# Patient Record
Sex: Female | Born: 1990 | Race: Black or African American | Hispanic: No | Marital: Single | Smoking: Never smoker
Health system: Southern US, Community
[De-identification: ages and names within clinical notes are randomized; demographics above are authoritative.]

---

## 2015-08-04 ENCOUNTER — Emergency Department (HOSPITAL_COMMUNITY): Payer: Medicaid Other

## 2015-08-04 ENCOUNTER — Emergency Department (HOSPITAL_COMMUNITY)
Admission: EM | Admit: 2015-08-04 | Discharge: 2015-08-04 | Disposition: A | Discharge status: Home / Self Care | Payer: Medicaid Other | Attending: Emergency Medicine | Admitting: Emergency Medicine

## 2015-08-04 ENCOUNTER — Encounter (HOSPITAL_COMMUNITY): Payer: Self-pay | Admitting: Nurse Practitioner

## 2015-08-04 DIAGNOSIS — R531 Weakness: Secondary | ICD-10-CM | POA: Insufficient documentation

## 2015-08-04 DIAGNOSIS — Z3A01 Less than 8 weeks gestation of pregnancy: Secondary | ICD-10-CM | POA: Insufficient documentation

## 2015-08-04 DIAGNOSIS — O210 Mild hyperemesis gravidarum: Secondary | ICD-10-CM | POA: Diagnosis not present

## 2015-08-04 DIAGNOSIS — O9989 Other specified diseases and conditions complicating pregnancy, childbirth and the puerperium: Secondary | ICD-10-CM | POA: Insufficient documentation

## 2015-08-04 DIAGNOSIS — Z880 Allergy status to penicillin: Secondary | ICD-10-CM | POA: Insufficient documentation

## 2015-08-04 DIAGNOSIS — R5383 Other fatigue: Secondary | ICD-10-CM | POA: Insufficient documentation

## 2015-08-04 DIAGNOSIS — R1011 Right upper quadrant pain: Secondary | ICD-10-CM

## 2015-08-04 LAB — COMPREHENSIVE METABOLIC PANEL
ALK PHOS: 49 U/L (ref 38–126)
ALT: 11 U/L — AB (ref 14–54)
ANION GAP: 8 (ref 5–15)
AST: 15 U/L (ref 15–41)
Albumin: 3.5 g/dL (ref 3.5–5.0)
BILIRUBIN TOTAL: 0.5 mg/dL (ref 0.3–1.2)
BUN: 6 mg/dL (ref 6–20)
CALCIUM: 9.2 mg/dL (ref 8.9–10.3)
CO2: 22 mmol/L (ref 22–32)
CREATININE: 0.51 mg/dL (ref 0.44–1.00)
Chloride: 104 mmol/L (ref 101–111)
Glucose, Bld: 80 mg/dL (ref 65–99)
Potassium: 3.5 mmol/L (ref 3.5–5.1)
SODIUM: 134 mmol/L — AB (ref 135–145)
TOTAL PROTEIN: 6.7 g/dL (ref 6.5–8.1)

## 2015-08-04 LAB — CBC
HCT: 38.1 % (ref 36.0–46.0)
HEMOGLOBIN: 13 g/dL (ref 12.0–15.0)
MCH: 29.5 pg (ref 26.0–34.0)
MCHC: 34.1 g/dL (ref 30.0–36.0)
MCV: 86.6 fL (ref 78.0–100.0)
PLATELETS: 196 10*3/uL (ref 150–400)
RBC: 4.4 MIL/uL (ref 3.87–5.11)
RDW: 12.4 % (ref 11.5–15.5)
WBC: 7 10*3/uL (ref 4.0–10.5)

## 2015-08-04 LAB — URINALYSIS, ROUTINE W REFLEX MICROSCOPIC
Glucose, UA: NEGATIVE mg/dL
HGB URINE DIPSTICK: NEGATIVE
Ketones, ur: >80 mg/dL — AB
NITRITE: NEGATIVE
PROTEIN: NEGATIVE mg/dL
SPECIFIC GRAVITY, URINE: 1.031 — AB (ref 1.005–1.030)
pH: 5.5 (ref 5.0–8.0)

## 2015-08-04 LAB — URINE MICROSCOPIC-ADD ON

## 2015-08-04 LAB — LIPASE, BLOOD: LIPASE: 22 U/L (ref 11–51)

## 2015-08-04 LAB — I-STAT BETA HCG BLOOD, ED (MC, WL, AP ONLY)

## 2015-08-04 LAB — HCG, QUANTITATIVE, PREGNANCY: hCG, Beta Chain, Quant, S: 71618 m[IU]/mL — ABNORMAL HIGH (ref <5)

## 2015-08-04 MED ORDER — METOCLOPRAMIDE HCL 5 MG/ML IJ SOLN
10.0000 mg | Freq: Once | INTRAMUSCULAR | Status: AC
  Administered 2015-08-04: 10 mg via INTRAVENOUS
  Filled 2015-08-04: qty 2

## 2015-08-04 MED ORDER — SODIUM CHLORIDE 0.9 % IV BOLUS (SEPSIS)
1000.0000 mL | Freq: Once | INTRAVENOUS | Status: AC
Start: 2015-08-04 — End: 2015-08-04
  Administered 2015-08-04: 1000 mL via INTRAVENOUS

## 2015-08-04 MED ORDER — SODIUM CHLORIDE 0.9 % IV BOLUS (SEPSIS)
1000.0000 mL | Freq: Once | INTRAVENOUS | Status: AC
  Administered 2015-08-04: 1000 mL via INTRAVENOUS

## 2015-08-04 MED ORDER — METOCLOPRAMIDE HCL 10 MG PO TABS: 10.0000 mg | ORAL_TABLET | Freq: Four times a day (QID) | ORAL | Status: AC

## 2015-08-04 NOTE — ED Provider Notes (Signed)
CSN: [id number]     Arrival date & time 08/04/15  1836 History   First MD Initiated Contact with Patient 08/04/15 2021     Chief Complaint  Patient presents with  . Emesis     (Consider location/radiation/quality/duration/timing/severity/associated sxs/prior Treatment) HPI Nicole Parsons is a 24 y.o. female who presents to emergency department complaining of nausea, vomiting, possible dehydration. Patient states she just found out 10 days ago that she is pregnant. [facility] [place]. Patient states she just moved to [place] and does not have an OB/GYN here. She states in the last week she has had persistent nausea and vomiting. She states she is unable to keep anything down including fluids. She did have an ultrasound to confirm pregnancy 10 days ago which showed intrauterine gestational sac, no cardiac activity at that time. 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 At this time she denies any abdominal pain or cramping, no vaginal discharge, no vaginal bleeding. She has not tried any medications for nausea or vomiting. She states she feels weak and feels like she could be dehydrated. She denies any urinary symptoms. Denies any fever or chills.  History reviewed. No pertinent past medical history. History reviewed. No pertinent past surgical history. History reviewed. No pertinent family history. Social History  Substance Use Topics  . Smoking status: Never Smoker   . Smokeless tobacco: None  . Alcohol Use: No   OB History    No data available     Review of Systems  Constitutional: Positive for fatigue. Negative for fever and chills.  Respiratory: Negative for cough, chest tightness and shortness of breath.   Cardiovascular: Negative for chest pain, palpitations and leg swelling.  Gastrointestinal: Positive for nausea and vomiting. Negative for abdominal pain and diarrhea.  Genitourinary: Negative for dysuria, flank pain, vaginal  bleeding, vaginal discharge, vaginal pain and pelvic pain.  Musculoskeletal: Negative for myalgias, arthralgias, neck pain and neck stiffness.  Skin: Negative for rash.  Neurological: Positive for weakness. Negative for dizziness and headaches.  All other systems reviewed and are negative.     Allergies  Amoxicillin  Home Medications   Prior to Admission medications   Not on File   BP 116/89 mmHg  Pulse 76  Temp(Src) 98.8 F (37.1 C) (Oral)  Resp 16  SpO2 100%  LMP 06/17/2015 Physical Exam  Constitutional: She appears well-developed and well-nourished. No distress.  HENT:  Head: Normocephalic.  Oral mucosa dry  Eyes: Conjunctivae are normal.  Neck: Neck supple.  Cardiovascular: Normal rate, regular rhythm and normal heart sounds.   Pulmonary/Chest: Effort normal and breath sounds normal. No respiratory distress. She has no wheezes. She has no rales.  Abdominal: Soft. Bowel sounds are normal. She exhibits no distension. There is no tenderness. There is no rebound and no guarding.  Musculoskeletal: She exhibits no edema.  Neurological: She is alert.  Skin: Skin is warm and dry.  Psychiatric: She has a normal mood and affect. Her behavior is normal.  Nursing note and vitals reviewed.   ED Course  Procedures (including critical care time) Labs Review Labs Reviewed  COMPREHENSIVE METABOLIC PANEL - Abnormal; Notable for the following:    Sodium 134 (*)    ALT 11 (*)    All other components within normal limits  URINALYSIS, ROUTINE W REFLEX MICROSCOPIC (NOT AT [facility]) - Abnormal; Notable for the following:    Color, Urine AMBER (*)    APPearance CLOUDY (*)  Specific Gravity, Urine 1.031 (*)    Bilirubin Urine SMALL (*)    Ketones, ur >80 (*)    Leukocytes, UA SMALL (*)    All other components within normal limits  URINE MICROSCOPIC-ADD ON - Abnormal; Notable for the following:    Squamous Epithelial / LPF 6-30 (*)    Bacteria, UA FEW (*)    All other components  within normal limits  HCG, QUANTITATIVE, PREGNANCY - Abnormal; Notable for the following:    hCG, Beta Chain, Quant, [place] [address] (*)    All other components within normal limits  I-STAT BETA HCG BLOOD, ED ([facility], [facility], [facility] ONLY) - Abnormal; Notable for the following:    I-stat hCG, quantitative >2000.0 (*)    All other components within normal limits  CBC  LIPASE, BLOOD    Imaging Review US Abdomen Limited Ruq  08/04/2015  CLINICAL DATA:  Right upper quadrant abdominal pain. EXAM: US ABDOMEN LIMITED - RIGHT UPPER QUADRANT COMPARISON:  None. FINDINGS: Gallbladder: Physiologically distended. No gallstones or wall thickening visualized. No sonographic Murphy sign noted. Common bile duct: Diameter: 2-3 mm, normal. Liver: No focal lesion identified. Within normal limits in parenchymal echogenicity. Normal directional flow in the main portal vein. IMPRESSION: Normal right upper quadrant ultrasound. Electronically Signed   By: Rubye Oaks M.D.   On: 08/04/2015 22:03   I have personally reviewed and evaluated these images and lab results as part of my medical decision-making.   EKG Interpretation None      MDM   Final diagnoses:  Hyperemesis gravidarum    patient with nausea, vomiting, approximately 7 month pregnant, IUP confirmed 10 days ago and is in care every where. Denies any pelvic pain, no vaginal discharge or bleeding at this time. Vital signs are normal. Patient's blood was obtained at triage and is unremarkable. She does report some epigastric abdominal pain, at this time no tenderness. Will start IV fluids, patient does have greater than 80 ketones in her urine. We'll get ultrasound right upper quadrant to rule out cholelithiasis as the cause of her pain and vomiting.  10:26 PM Ultrasound is negative. Patient feels much better after 2 L of IV fluids and Reglan. She states she is ready to go home. I will discharge her home with Reglan. Will have a [facility]-up with [facility].  Instructed to go to [facility] if any issues. I did discuss with her that there was no cardiac activity seen on her ultrasound 10 days ago and that it may have been too early but that she does need to follow-up to make sure her pregnancy progresses normally. Patient voiced understanding  Filed Vitals:   08/04/15 1844 08/04/15 2122 08/04/15 2227  BP: 113/66 116/89 121/85  Pulse: 76 76 71  Temp: 98.8 F (37.1 C)    TempSrc: Oral    Resp: 16  17  SpO2: 98% 100% 100%     Jaynie Crumble, PA-C 08/05/15 0555  Mancel Bale, MD 08/05/15 1151

## 2015-08-04 NOTE — ED Notes (Addendum)
She c/o 2 weeks history severe n/v, headaches. She had a fever and some intermittent upper abd pain yesterday. She is [redacted] weeks pregnant. States shes concerned because she can not tolerate any oral intake. She has OBGYN doctor in [place] btu needs to transfer here because she doesn't live there anymore

## 2015-08-04 NOTE — ED Notes (Signed)
Pt stable, ambulatory, states understanding of discharge instructions 

## 2015-08-04 NOTE — Discharge Instructions (Signed)
Take Reglan as prescribed as needed for nausea and vomiting. Make sure to drink plenty of fluids to prevent dehydration. Follow[facility] or go to [facility] if any issues. You will need a repeat ultrasound, to make sure your pregnancy progresses normally  Hyperemesis Gravidarum Hyperemesis gravidarum is a severe form of nausea and vomiting that happens during pregnancy. Hyperemesis is worse than morning sickness. It may cause you to have nausea or vomiting all day for many days. It may keep you from eating and drinking enough food and liquids. Hyperemesis usually occurs during the first half (the first 20 weeks) of pregnancy. It often goes away once a woman is in her second half of pregnancy. However, sometimes hyperemesis continues through an entire pregnancy.  CAUSES  The cause of this condition is not completely known but is thought to be related to changes in the body's hormones when pregnant. It could be from the high level of the pregnancy hormone or an increase in estrogen in the body.  SIGNS AND SYMPTOMS   Severe nausea and vomiting.  Nausea that does not go away.  Vomiting that does not allow you to keep any food down.  Weight loss and body fluid loss (dehydration).  Having no desire to eat or not liking food you have previously enjoyed. DIAGNOSIS  Your health care provider will do a physical exam and ask you about your symptoms. He or she may also order blood tests and urine tests to make sure something else is not causing the problem.  TREATMENT  You may only need medicine to control the problem. If medicines do not control the nausea and vomiting, you will be treated in the hospital to prevent dehydration, increased acid in the blood (acidosis), weight loss, and changes in the electrolytes in your body that may harm the unborn baby (fetus). You may need IV fluids.  HOME CARE INSTRUCTIONS   Only take over-the-counter or prescription medicines as directed  by your health care provider.  Try eating a couple of dry crackers or toast in the morning before getting out of bed.  Avoid foods and smells that upset your stomach.  Avoid fatty and spicy foods.  Eat 5-6 small meals a day.  Do not drink when eating meals. Drink between meals.  For snacks, eat high-protein foods, such as cheese.  Eat or suck on things that have ginger in them. Ginger helps nausea.  Avoid food preparation. The smell of food can spoil your appetite.  Avoid iron pills and iron in your multivitamins until after 3-4 months of being pregnant. However, consult with your health care provider before stopping any prescribed iron pills. SEEK MEDICAL CARE IF:   Your abdominal pain increases.  You have a severe headache.  You have vision problems.  You are losing weight. SEEK IMMEDIATE MEDICAL CARE IF:   You are unable to keep fluids down.  You vomit blood.  You have constant nausea and vomiting.  You have excessive weakness.  You have extreme thirst.  You have dizziness or fainting.  You have a fever or persistent symptoms for more than 2-3 days.  You have a fever and your symptoms suddenly get worse. MAKE SURE YOU:   Understand these instructions.  Will watch your condition.  Will get help right away if you are not doing well or get worse.   This information is not intended to replace advice given to you by your health care provider. Make sure you discuss any questions  you have with your health care provider.   Document Released: 09/02/2005 Document Revised: 06/23/2013 Document Reviewed: 04/14/2013 Elsevier Interactive Patient Education [employer]

## 2017-05-22 IMAGING — US US ABDOMEN LIMITED
1 series · 14 of 25 positions shown · non-contrast
Comparison: None.

CLINICAL DATA: Right upper quadrant abdominal pain.

EXAM:
US ABDOMEN LIMITED - RIGHT UPPER QUADRANT

[Series 1: us abdomen limited · 0.15mm/px · 14 of 44 slices shown]
[im 1/44]
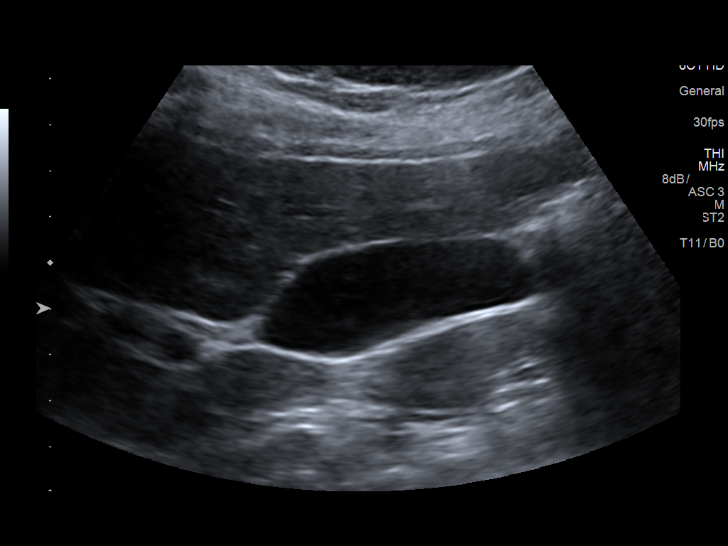
[im 4/44]
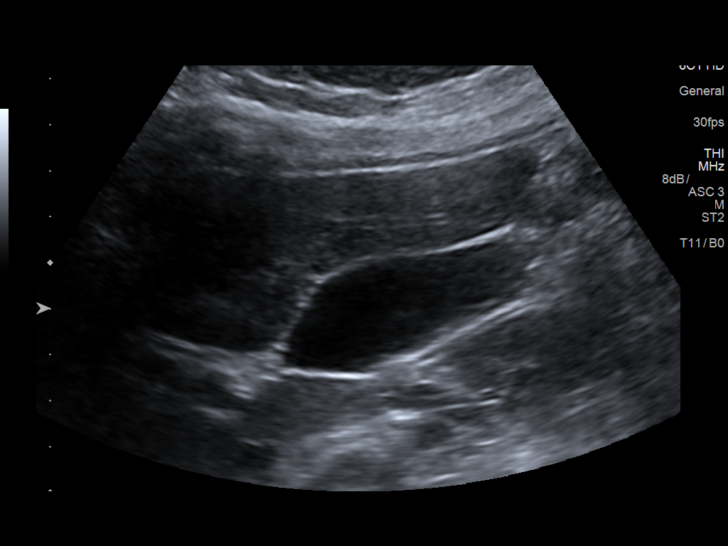
[im 8/44]
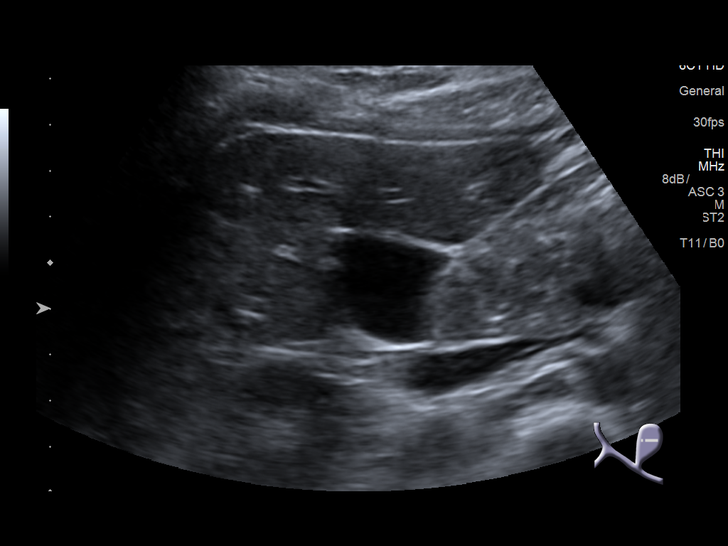
[im 11/44]
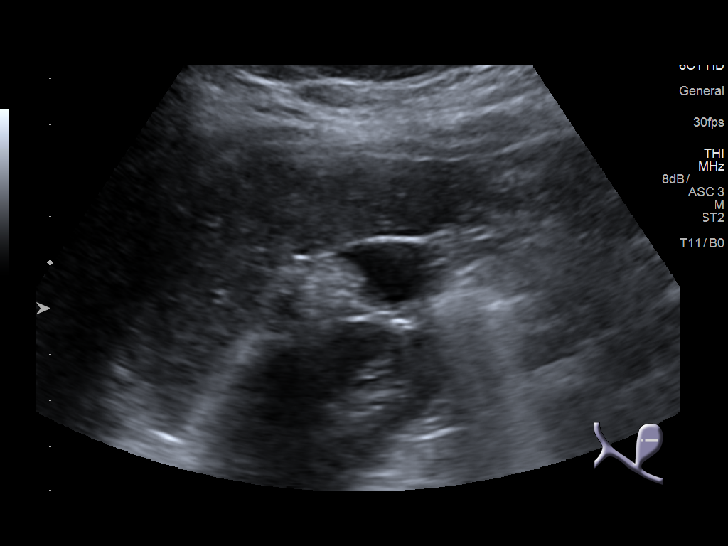
[im 15/44]
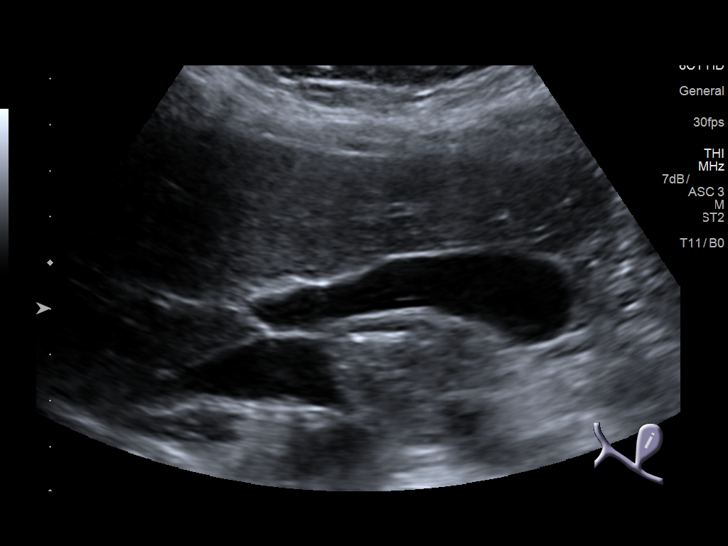
[im 17/44]
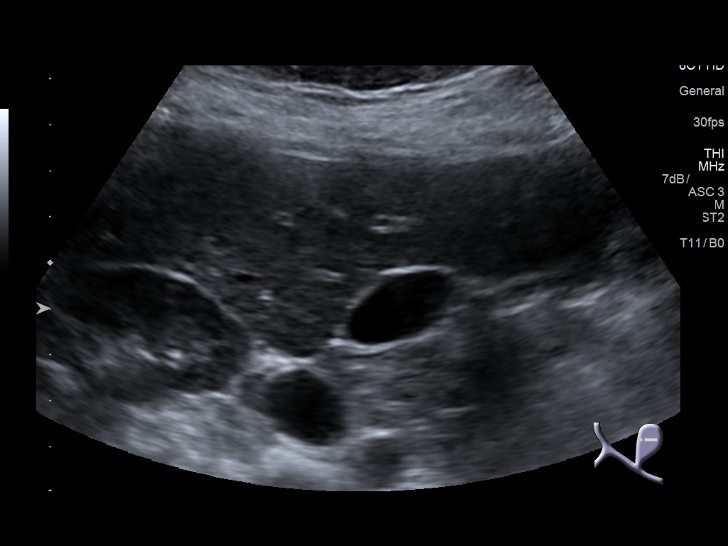
[im 20/44]
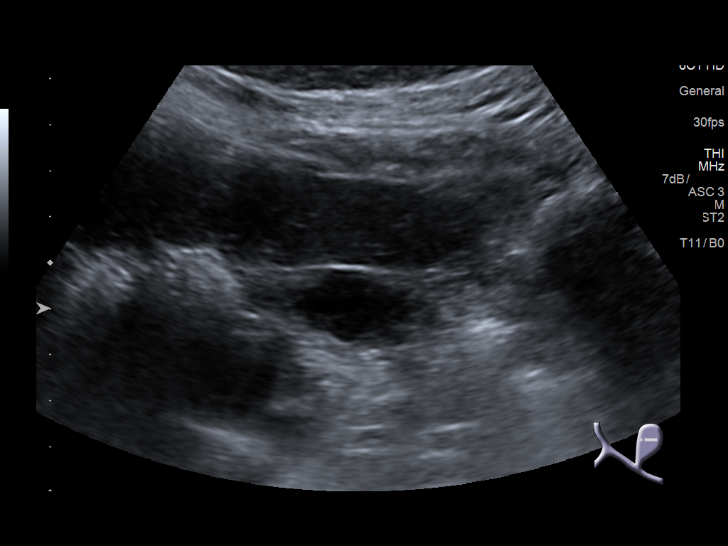
[im 24/44]
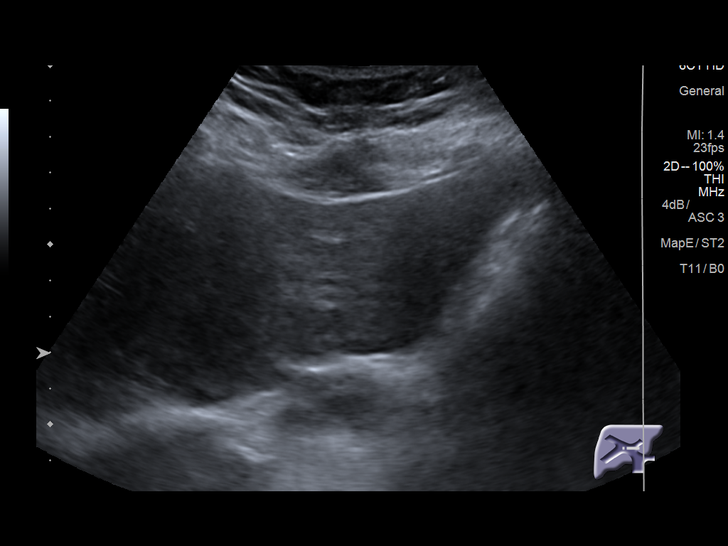
[im 27/44]
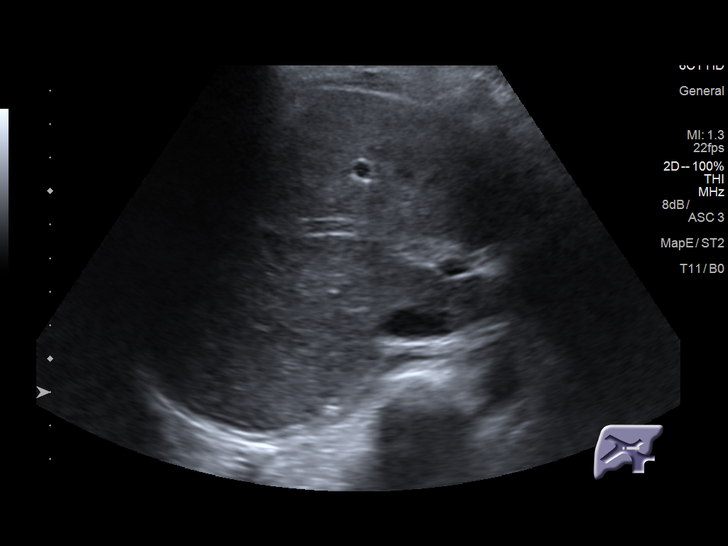
[im 29/44]
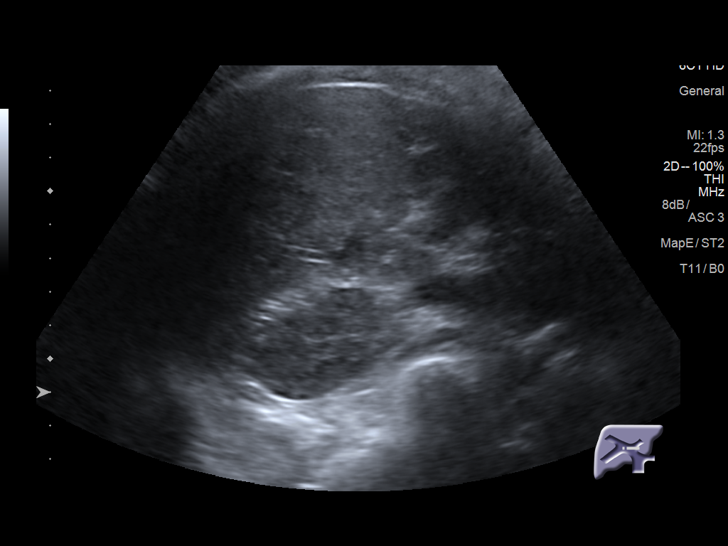
[im 33/44]
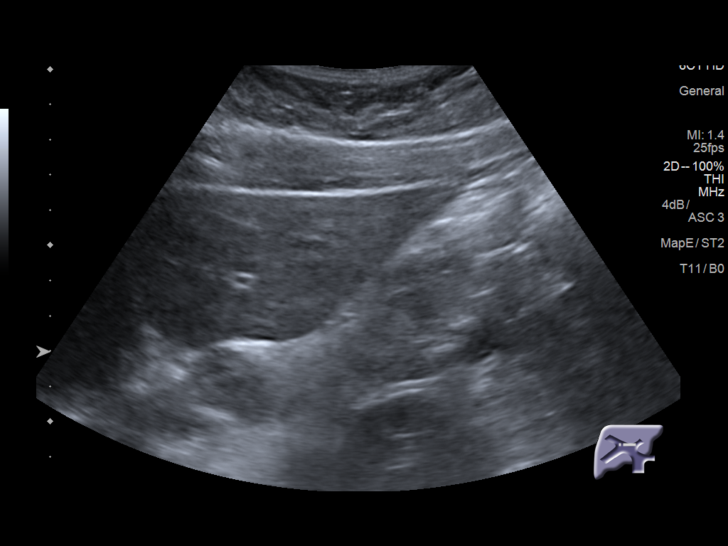
[im 36/44]
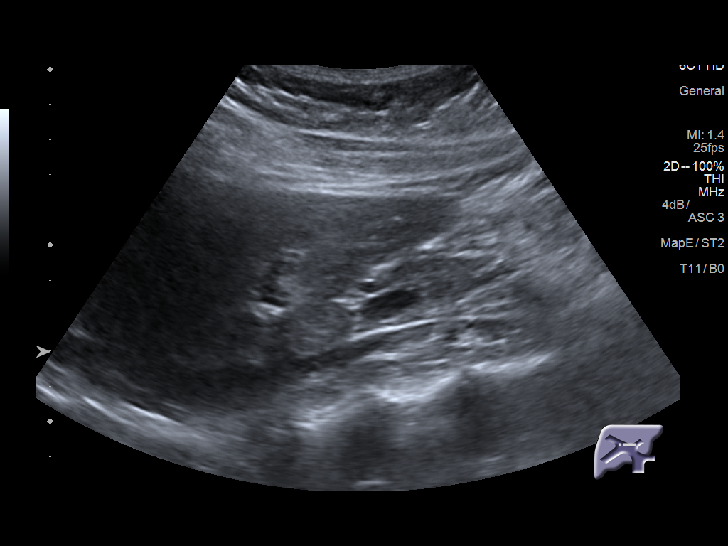
[im 40/44]
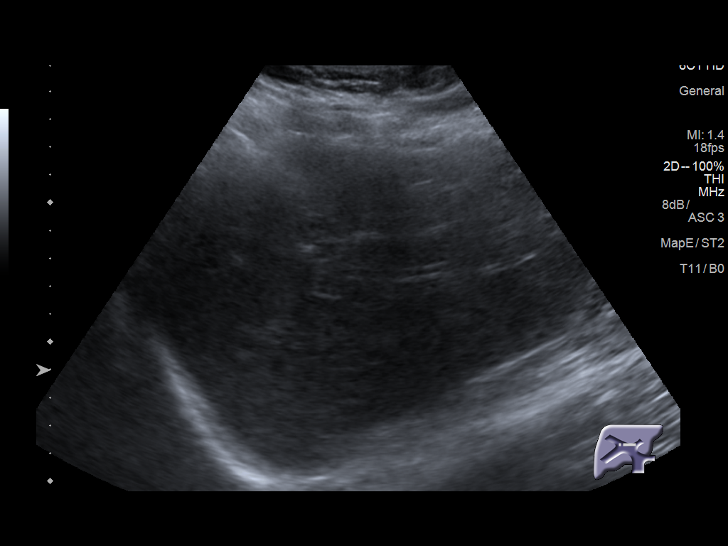
[im 44/44]
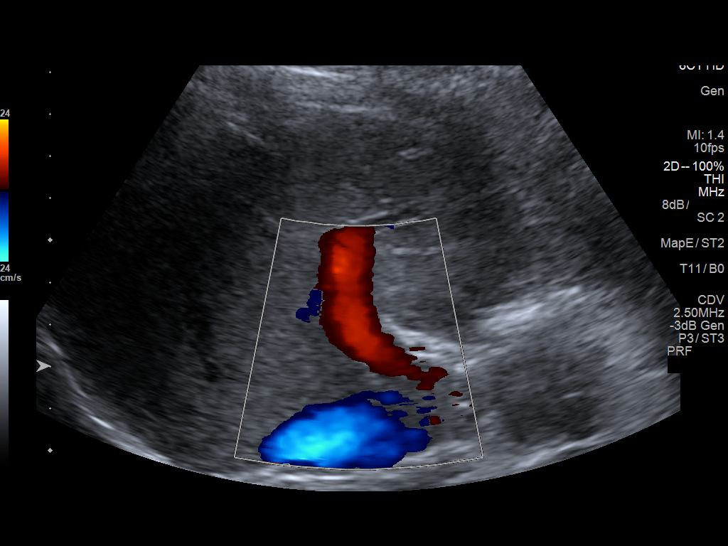

[14 of 25 positions shown; findings below may reference images not displayed]

FINDINGS: Gallbladder:

Physiologically distended. No gallstones or wall thickening
visualized. No sonographic Murphy sign noted.

Common bile duct:

Diameter: 2-3 mm, normal.

Liver:

No focal lesion identified. Within normal limits in parenchymal
echogenicity. Normal directional flow in the main portal vein.
IMPRESSION: Normal right upper quadrant ultrasound.
